# Patient Record
Sex: Female | Born: 1965 | Race: White | Hispanic: No | Marital: Single | Smoking: Former smoker
Health system: Southern US, Community
[De-identification: ages and names within clinical notes are randomized; demographics above are authoritative.]

## PROBLEM LIST (undated history)

## (undated) DIAGNOSIS — J302 Other seasonal allergic rhinitis: Secondary | ICD-10-CM

## (undated) DIAGNOSIS — E119 Type 2 diabetes mellitus without complications: Secondary | ICD-10-CM

## (undated) HISTORY — PX: BREAST REDUCTION SURGERY: SHX8

---

## 2015-01-02 ENCOUNTER — Emergency Department (HOSPITAL_COMMUNITY): Payer: Self-pay

## 2015-01-02 ENCOUNTER — Emergency Department (HOSPITAL_COMMUNITY)
Admission: EM | Admit: 2015-01-02 | Discharge: 2015-01-02 | Disposition: A | Discharge status: Home / Self Care | Payer: Self-pay | Attending: Emergency Medicine | Admitting: Emergency Medicine

## 2015-01-02 ENCOUNTER — Encounter (HOSPITAL_COMMUNITY): Payer: Self-pay | Admitting: *Deleted

## 2015-01-02 DIAGNOSIS — Z79899 Other long term (current) drug therapy: Secondary | ICD-10-CM | POA: Insufficient documentation

## 2015-01-02 DIAGNOSIS — Z87891 Personal history of nicotine dependence: Secondary | ICD-10-CM | POA: Insufficient documentation

## 2015-01-02 DIAGNOSIS — J40 Bronchitis, not specified as acute or chronic: Secondary | ICD-10-CM | POA: Insufficient documentation

## 2015-01-02 DIAGNOSIS — Z880 Allergy status to penicillin: Secondary | ICD-10-CM | POA: Insufficient documentation

## 2015-01-02 DIAGNOSIS — Z7951 Long term (current) use of inhaled steroids: Secondary | ICD-10-CM | POA: Insufficient documentation

## 2015-01-02 DIAGNOSIS — E119 Type 2 diabetes mellitus without complications: Secondary | ICD-10-CM | POA: Insufficient documentation

## 2015-01-02 HISTORY — DX: Type 2 diabetes mellitus without complications: E11.9

## 2015-01-02 HISTORY — DX: Other seasonal allergic rhinitis: J30.2

## 2015-01-02 MED ORDER — ALBUTEROL SULFATE HFA 108 (90 BASE) MCG/ACT IN AERS
4.0000 | INHALATION_SPRAY | Freq: Once | RESPIRATORY_TRACT | Status: AC
  Administered 2015-01-02: 4 via RESPIRATORY_TRACT
  Filled 2015-01-02: qty 6.7

## 2015-01-02 NOTE — ED Provider Notes (Signed)
CSN: [id number]     Arrival date & time 01/02/15  1044 History   First MD Initiated Contact with Patient 01/02/15 1129     Chief Complaint  Patient presents with  . Nasal Congestion  . Cough     (Consider location/radiation/quality/duration/timing/severity/associated sxs/prior Treatment) HPI  49 year old female with a history of diabetes but no prior history of asthma or COPD presents with cough, congestion, and wheezing with shortness of breath over the last 4 days. Got worse last night. Patient denies a fever. No leg swelling. Patient states she has had wheezing like this before about 4 years ago. She recently moved to this area but had pulmonary function testing in the past that indicated no asthma. Has a grandbaby at home that has similar respiratory symptoms. The patient has not tried any over-the-counter medicines because she is not sure what would interact with her diabetes. Is not short of breath at rest, only with significant exertion or in active coughing.  Past Medical History  Diagnosis Date  . Diabetes mellitus without complication   . Seasonal allergies    Past Surgical History  Procedure Laterality Date  . Breast reduction surgery     No family history on file. Social History  Substance Use Topics  . Smoking status: Former Smoker    Types: Cigarettes    Quit date: 01/02/1995  . Smokeless tobacco: None  . Alcohol Use: Yes     Comment: 1-2 a month   OB History    No data available     Review of Systems  Constitutional: Negative for fever.  HENT: Positive for congestion. Negative for ear pain and sore throat.   Respiratory: Positive for cough, shortness of breath and wheezing.   Gastrointestinal: Negative for vomiting.  All other systems reviewed and are negative.     Allergies  Amoxicillin  Home Medications   Prior to Admission medications   Medication Sig Start Date End Date Taking? Authorizing Provider  Alogliptin-Metformin HCl (KAZANO) 12.08-998  MG TABS Take 1 tablet by mouth 2 (two) times daily.   Yes Historical Provider, MD  fluticasone (FLONASE) 50 MCG/ACT nasal spray Place 1 spray into both nostrils daily as needed for allergies or rhinitis.   Yes Historical Provider, MD  Multiple Vitamin (MULTIVITAMIN WITH MINERALS) TABS tablet Take 1 tablet by mouth daily.   Yes Historical Provider, MD   BP 114/57 mmHg  Pulse 109  Temp(Src) 98.9 F (37.2 C) (Oral)  Resp 16  SpO2 100%  LMP 12/14/2014 Physical Exam  Constitutional: She is oriented to person, place, and time. She appears well-developed and well-nourished.  HENT:  Head: Normocephalic and atraumatic.  Right Ear: External ear normal.  Left Ear: External ear normal.  Nose: Nose normal.  Eyes: Right eye exhibits no discharge. Left eye exhibits no discharge.  Cardiovascular: Normal rate, regular rhythm and normal heart sounds.   HR ~100  Pulmonary/Chest: Effort normal. She has wheezes (mild expiratory wheezes).  Normal WOB, speaks in complete sentences  Abdominal: Soft. There is no tenderness.  Neurological: She is alert and oriented to person, place, and time.  Skin: Skin is warm and dry.  Nursing note and vitals reviewed.   ED Course  Procedures (including critical care time) Labs Review Labs Reviewed - No data to display  Imaging Review Dg Chest 2 View  01/02/2015   CLINICAL DATA:  Abnormal breath sounds. Productive cough. Wheezing.  EXAM: CHEST  2 VIEW  COMPARISON:  None.  FINDINGS: Cardiopericardial silhouette within normal limits.  Mediastinal contours normal. Trachea midline. No airspace disease or effusion.  IMPRESSION: No active cardiopulmonary disease.   Electronically Signed   By: Andreas Newport M.D.   On: 01/02/2015 11:58   I have personally reviewed and evaluated these images and lab results as part of my medical decision-making.   EKG Interpretation None      MDM   Final diagnoses:  Bronchitis    Patient symptoms are consistent with mild  bronchitis. No significant increased work of breathing and has only mild wheezing. Feels much better with albuterol. She has no history of lung disease and I do not feel that steroid to be beneficial and likely would be harmful given her history of hyperglycemia/diabetes. Plan to treat with albuterol when necessary as well as over-the-counter treatments for her URI. No signs of acute bacterial pathology. She has been given resources for primary care and recommended to follow-up here if symptoms worsen.    Pricilla Loveless, MD 01/02/15 [id number]

## 2015-01-02 NOTE — ED Notes (Addendum)
Patient comes from home with c/o dyspnea, nasal congestion, cough productive of yellow sputum, and sinus pain/pressure x4 days.  Patient denies N/V/D and fever. No hx of asthma or COPD.

## 2015-01-02 NOTE — ED Notes (Signed)
Patient presents to the ED from home with complaints of dyspnea, nasal congestion, rhinorrhea, cough productive of yellow sputum and sinus pain/pressure  For 4 days.  Patient denies N/V/D, abdominal pain, chest pain, dizziness and fever.    On exam, patient has end expiratory wheezing in all lobes to auscultation.  Heart sounds WNL, S1/S2, no gallop, rub or murmur.  +2 radial and pedal pulses.  No pre-tibial or pedal edema.  Skin warm and dry.

## 2015-01-02 NOTE — Discharge Instructions (Signed)
Use the inhaler 2 puffs every 4 hours as needed for wheezing or shortness of breath.   Acute Bronchitis Bronchitis is inflammation of the airways that extend from the windpipe into the lungs (bronchi). The inflammation often causes mucus to develop. This leads to a cough, which is the most common symptom of bronchitis.  In acute bronchitis, the condition usually develops suddenly and goes away over time, usually in a couple weeks. Smoking, allergies, and asthma can make bronchitis worse. Repeated episodes of bronchitis may cause further lung problems.  CAUSES Acute bronchitis is most often caused by the same virus that causes a cold. The virus can spread from person to person (contagious) through coughing, sneezing, and touching contaminated objects. SIGNS AND SYMPTOMS   Cough.   Fever.   Coughing up mucus.   Body aches.   Chest congestion.   Chills.   Shortness of breath.   Sore throat.  DIAGNOSIS  Acute bronchitis is usually diagnosed through a physical exam. Your health care provider will also ask you questions about your medical history. Tests, such as chest X-rays, are sometimes done to rule out other conditions.  TREATMENT  Acute bronchitis usually goes away in a couple weeks. Oftentimes, no medical treatment is necessary. Medicines are sometimes given for relief of fever or cough. Antibiotic medicines are usually not needed but may be prescribed in certain situations. In some cases, an inhaler may be recommended to help reduce shortness of breath and control the cough. A cool mist vaporizer may also be used to help thin bronchial secretions and make it easier to clear the chest.  HOME CARE INSTRUCTIONS  Get plenty of rest.   Drink enough fluids to keep your urine clear or pale yellow (unless you have a medical condition that requires fluid restriction). Increasing fluids may help thin your respiratory secretions (sputum) and reduce chest congestion, and it will  prevent dehydration.   Take medicines only as directed by your health care provider.  If you were prescribed an antibiotic medicine, finish it all even if you start to feel better.  Avoid smoking and secondhand smoke. Exposure to cigarette smoke or irritating chemicals will make bronchitis worse. If you are a smoker, consider using nicotine gum or skin patches to help control withdrawal symptoms. Quitting smoking will help your lungs heal faster.   Reduce the chances of another bout of acute bronchitis by washing your hands frequently, avoiding people with cold symptoms, and trying not to touch your hands to your mouth, nose, or eyes.   Keep all follow-up visits as directed by your health care provider.  SEEK MEDICAL CARE IF: Your symptoms do not improve after 1 week of treatment.  SEEK IMMEDIATE MEDICAL CARE IF:  You develop an increased fever or chills.   You have chest pain.   You have severe shortness of breath.  You have bloody sputum.   You develop dehydration.  You faint or repeatedly feel like you are going to pass out.  You develop repeated vomiting.  You develop a severe headache. MAKE SURE YOU:   Understand these instructions.  Will watch your condition.  Will get help right away if you are not doing well or get worse. Document Released: 05/01/2004 Document Revised: 08/08/2013 Document Reviewed: 09/14/2012 [facility] Patient Information 2015 [place], [place]. This information is not intended to replace advice given to you by your health care provider. Make sure you discuss any questions you have with your health care provider.

## 2015-01-02 NOTE — Progress Notes (Signed)
CM spoke with pt who confirms uninsured [employer] resident with no pcp.  CM discussed and provided written information for uninsured accepting pcps, discussed the importance of pcp vs EDP services for f/u care, www.needymeds.org, www.goodrx.com, discounted pharmacies and other [employer] such as [facility] , [facility], affordable care act, financial assistance, uninsured dental services, [place] med assist, DSS and  health department  Reviewed resources for [employer] uninsured accepting pcps like [facility], family medicine at [facility], [facility], [facility], local urgent care centers, [facility], [facility] family practice, general medical clinics, family services of the [place], [facility] urgent care plus others, medication resources, [facility] out patient pharmacies and housing Pt voiced understanding and appreciation of resources provided   Provided [facility] contact information Pt seen by [facility] staff while in ED and given resources for f/u pcps Cm placed CM resources and [facility] resources in pt belonging bag

## 2015-01-20 ENCOUNTER — Encounter (HOSPITAL_COMMUNITY): Payer: Self-pay

## 2015-01-20 ENCOUNTER — Emergency Department (HOSPITAL_COMMUNITY)
Admission: EM | Admit: 2015-01-20 | Discharge: 2015-01-20 | Disposition: A | Discharge status: Home / Self Care | Payer: Self-pay | Attending: Emergency Medicine | Admitting: Emergency Medicine

## 2015-01-20 DIAGNOSIS — E119 Type 2 diabetes mellitus without complications: Secondary | ICD-10-CM | POA: Insufficient documentation

## 2015-01-20 DIAGNOSIS — J209 Acute bronchitis, unspecified: Secondary | ICD-10-CM | POA: Insufficient documentation

## 2015-01-20 DIAGNOSIS — Z880 Allergy status to penicillin: Secondary | ICD-10-CM | POA: Insufficient documentation

## 2015-01-20 DIAGNOSIS — Z87891 Personal history of nicotine dependence: Secondary | ICD-10-CM | POA: Insufficient documentation

## 2015-01-20 DIAGNOSIS — Z79899 Other long term (current) drug therapy: Secondary | ICD-10-CM | POA: Insufficient documentation

## 2015-01-20 DIAGNOSIS — J0100 Acute maxillary sinusitis, unspecified: Secondary | ICD-10-CM | POA: Insufficient documentation

## 2015-01-20 DIAGNOSIS — J40 Bronchitis, not specified as acute or chronic: Secondary | ICD-10-CM

## 2015-01-20 LAB — CBG MONITORING, ED: GLUCOSE-CAPILLARY: 142 mg/dL — AB (ref 65–99)

## 2015-01-20 MED ORDER — IPRATROPIUM-ALBUTEROL 0.5-2.5 (3) MG/3ML IN SOLN
3.0000 mL | Freq: Once | RESPIRATORY_TRACT | Status: AC
  Administered 2015-01-20: 3 mL via RESPIRATORY_TRACT
  Filled 2015-01-20: qty 3

## 2015-01-20 MED ORDER — AZITHROMYCIN 250 MG PO TABS: ORAL_TABLET | ORAL | Status: AC

## 2015-01-20 MED ORDER — PREDNISONE 20 MG PO TABS: ORAL_TABLET | ORAL | Status: AC

## 2015-01-20 MED ORDER — PREDNISONE 20 MG PO TABS
60.0000 mg | ORAL_TABLET | Freq: Once | ORAL | Status: AC
  Administered 2015-01-20: 60 mg via ORAL
  Filled 2015-01-20: qty 3

## 2015-01-20 NOTE — ED Provider Notes (Signed)
CSN: [id number]     Arrival date & time 01/20/15  1255 History   First MD Initiated Contact with Patient 01/20/15 1303     Chief Complaint  Patient presents with  . Wheezing     (Consider location/radiation/quality/duration/timing/severity/associated sxs/prior Treatment) The history is provided by the patient.  Brandi Craig is a 49 y.o. female hx of DM, seasonal allergies here with cough, congestion, wheezing. Patient states that she's had been productive cough with yellow sputum for the last 2 weeks. Denies any fevers or chills. Has been trying Mucinex with no relief. She also has been having sinus congestion with yellowish greenish discharge. She came in 2 weeks ago for similar symptoms and was prescribed albuterol but didn't help. States that she is a former smoker and has no history of asthma or COPD but does have seasonal allergies. States that she has something similar before that improved with steroids.   Past Medical History  Diagnosis Date  . Diabetes mellitus without complication (HCC)   . Seasonal allergies    Past Surgical History  Procedure Laterality Date  . Breast reduction surgery     No family history on file. Social History  Substance Use Topics  . Smoking status: Former Smoker    Types: Cigarettes    Quit date: 01/02/1995  . Smokeless tobacco: None  . Alcohol Use: Yes     Comment: 1-2 a month   OB History    No data available     Review of Systems  Respiratory: Positive for wheezing.   All other systems reviewed and are negative.     Allergies  Amoxicillin  Home Medications   Prior to Admission medications   Medication Sig Start Date End Date Taking? Authorizing Provider  Alogliptin-Metformin HCl (KAZANO) 12.08-998 MG TABS Take 1 tablet by mouth 2 (two) times daily.   Yes Historical Provider, MD  fluticasone (FLONASE) 50 MCG/ACT nasal spray Place 1 spray into both nostrils daily as needed for allergies or rhinitis.   Yes Historical Provider,  MD  Multiple Vitamin (MULTIVITAMIN WITH MINERALS) TABS tablet Take 1 tablet by mouth daily.   Yes Historical Provider, MD   BP 133/74 mmHg  Pulse 97  Temp(Src) 98.5 F (36.9 C) (Oral)  Resp 16  SpO2 99%  LMP 01/13/2015 Physical Exam  Constitutional: She is oriented to person, place, and time.  Slightly tachypneic   HENT:  Head: Normocephalic.  Mouth/Throat: Oropharynx is clear and moist.  Bilateral maxillary sinus tenderness, no obvious nasal discharge   Eyes: Conjunctivae are normal. Pupils are equal, round, and reactive to light.  Neck: Normal range of motion. Neck supple.  Cardiovascular: Normal rate, regular rhythm and normal heart sounds.   Pulmonary/Chest:  Slightly tachypneic, mild diffuse wheezing, mod air movement   Abdominal: Soft. Bowel sounds are normal. She exhibits no distension. There is no tenderness. There is no rebound.  Musculoskeletal: Normal range of motion. She exhibits no edema or tenderness.  Neurological: She is alert and oriented to person, place, and time.  Skin: Skin is warm and dry.  Psychiatric: She has a normal mood and affect. Her behavior is normal. Judgment and thought content normal.  Nursing note and vitals reviewed.   ED Course  Procedures (including critical care time) Labs Review Labs Reviewed  CBG MONITORING, ED - Abnormal; Notable for the following:    Glucose-Capillary 142 (*)    All other components within normal limits    Imaging Review No results found. I have personally reviewed and  evaluated these images and lab results as part of my medical decision-making.   EKG Interpretation None      MDM   Final diagnoses:  None    Brandi Craig is a 49 y.o. female here with cough, wheezing, sinus congestion. Likely sinusitis clinically and has PCN allergy so will try zpack. In addition has asthma vs seasonal allergies. Had nl CXR 2 weeks ago and no focal crackles so will not repeat CXR. Will give nebs, steroids.   2:13  PM Felt better with steroids, albuterol. Still has albuterol at home. Minimal wheezing, improved air movements. Will dc home with prednisone, zpack (for sinusitis).   Richardean Canalavid H Eiza Canniff, MD 01/20/15 [phone]

## 2015-01-20 NOTE — ED Notes (Signed)
She c/o persistent wheezing/cough/sinus dnrng. X 2 weeks.

## 2015-01-20 NOTE — Discharge Instructions (Signed)
Take prednisone as prescribed.  Continue albuterol every 4 hrs as needed.  Take zpack as prescribed.  See your doctor.   Return to ER if you have worse cough, shortness of breath, chest pain, fevers.

## 2016-10-10 IMAGING — CR DG CHEST 2V
2 series · 2 of 2 positions shown · non-contrast
Comparison: None.

CLINICAL DATA: Abnormal breath sounds. Productive cough. Wheezing.

EXAM:
CHEST  2 VIEW

[w chest pa]
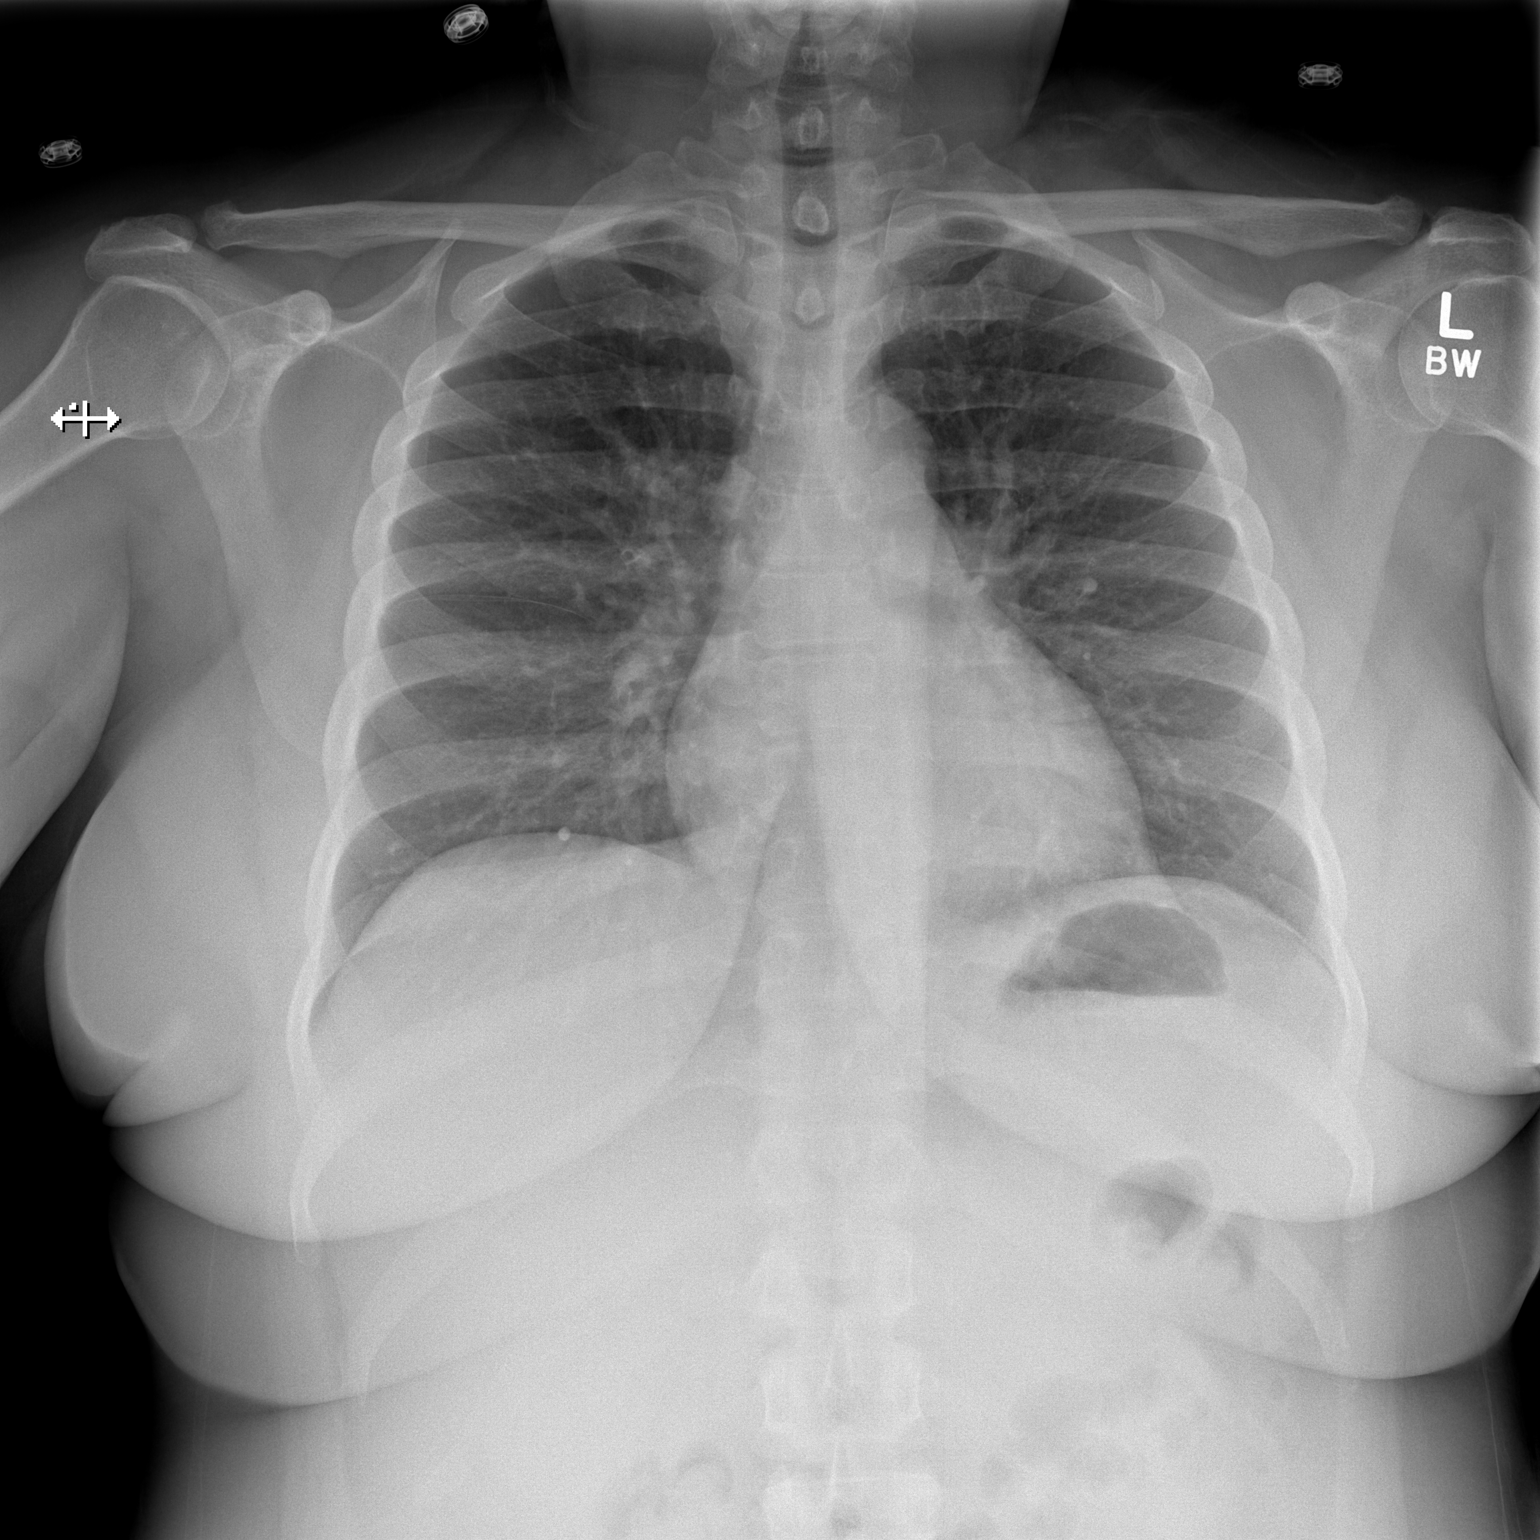

[w chest lat]
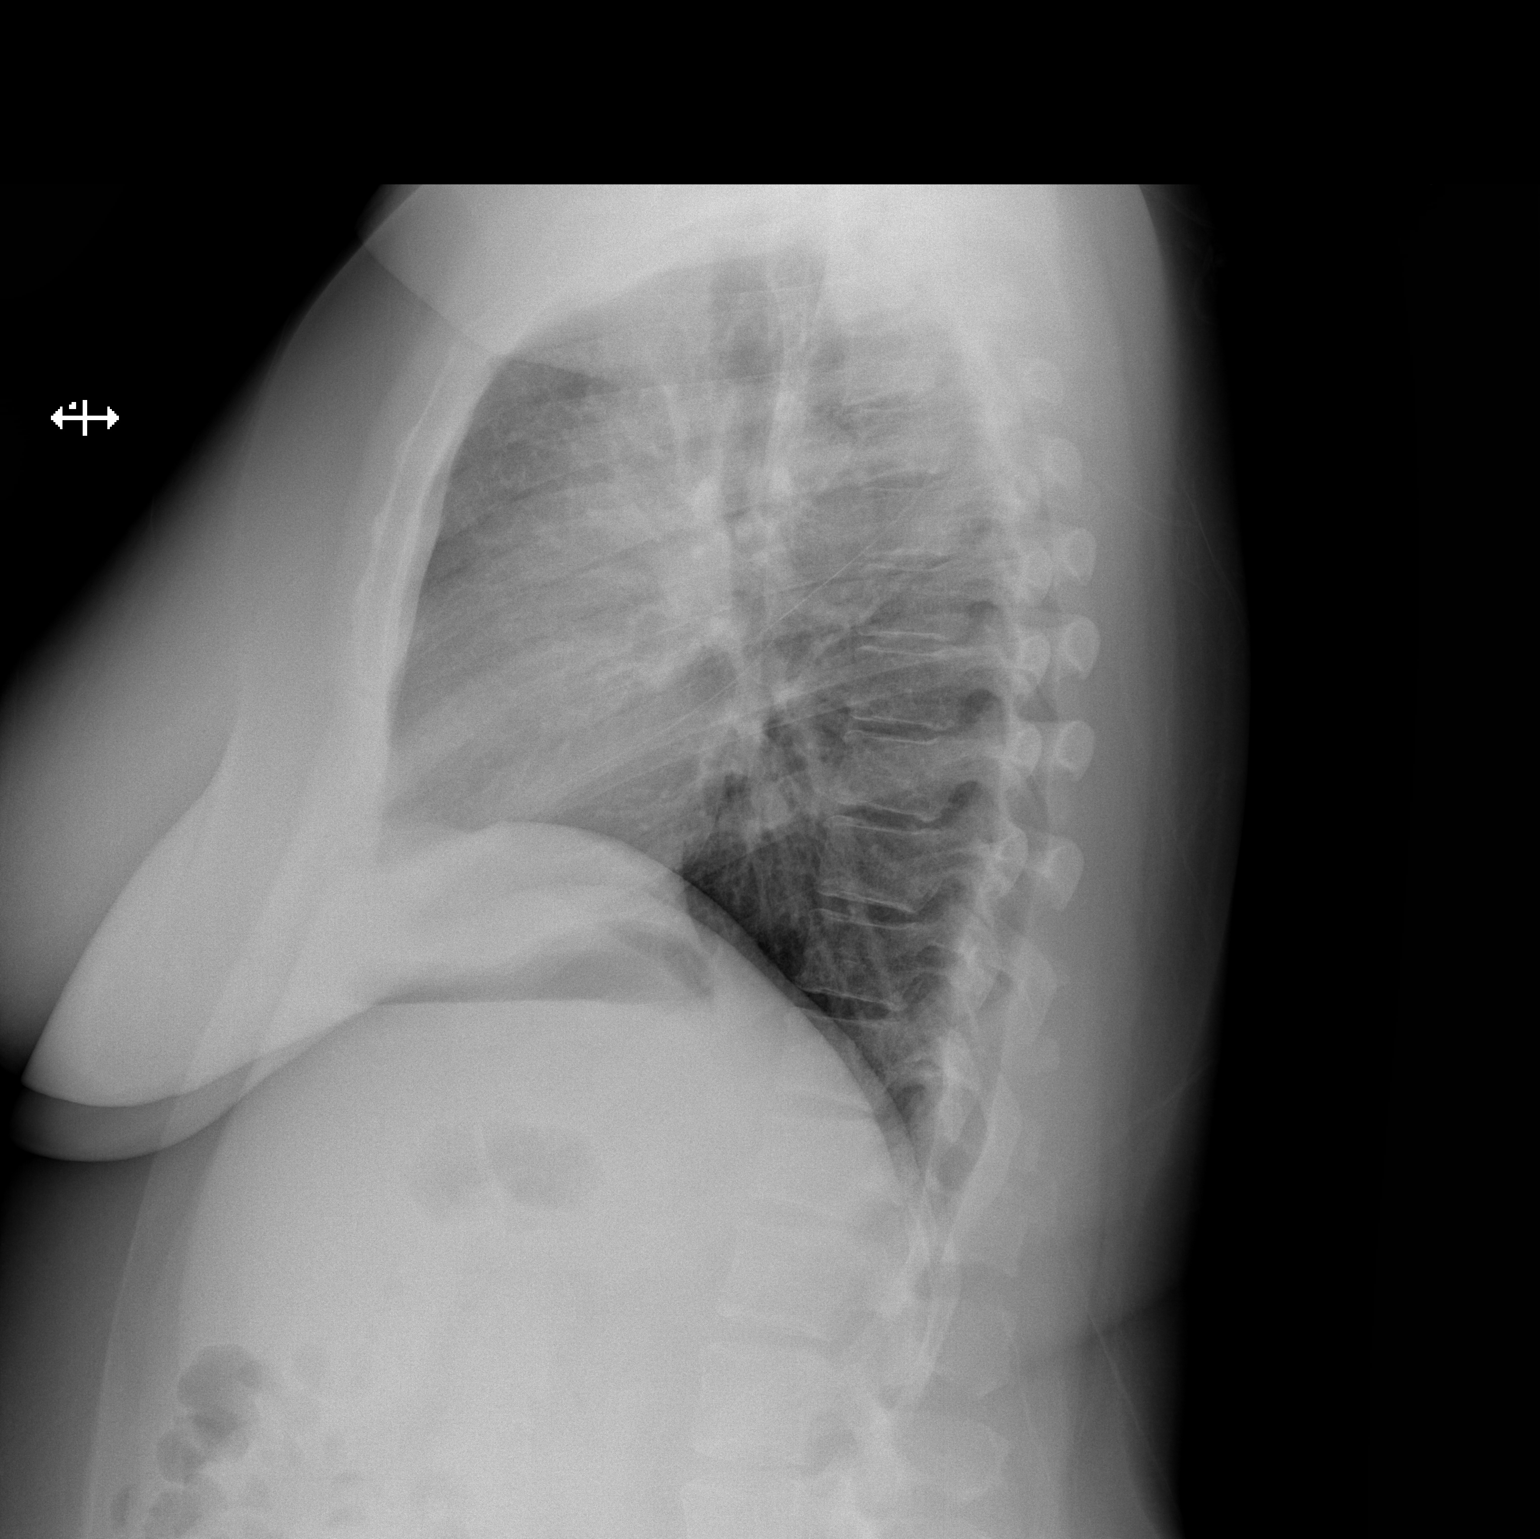

[2 of 2 positions shown; findings below may reference images not displayed]

FINDINGS: Cardiopericardial silhouette within normal limits. Mediastinal
contours normal. Trachea midline. No airspace disease or effusion.
IMPRESSION: No active cardiopulmonary disease.
